# Patient Record
Sex: Female | Born: 2000 | Race: Black or African American | Hispanic: No | Marital: Single | Smoking: Never smoker
Health system: Southern US, Community
[De-identification: ages and names within clinical notes are randomized; demographics above are authoritative.]

## PROBLEM LIST (undated history)

## (undated) DIAGNOSIS — F909 Attention-deficit hyperactivity disorder, unspecified type: Secondary | ICD-10-CM

## (undated) DIAGNOSIS — F329 Major depressive disorder, single episode, unspecified: Secondary | ICD-10-CM

## (undated) DIAGNOSIS — F32A Depression, unspecified: Secondary | ICD-10-CM

## (undated) HISTORY — PX: WISDOM TOOTH EXTRACTION: SHX21

---
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
## 2021-01-06 ENCOUNTER — Other Ambulatory Visit: Payer: Self-pay

## 2021-01-06 MED ORDER — AMOXICILLIN 500 MG PO CAPS
500.0000 mg | ORAL_CAPSULE | Freq: Three times a day (TID) | ORAL | 0 refills | Status: DC
  Filled 2021-01-06: qty 21, 7d supply, fill #0

## 2021-01-06 MED ORDER — CHLORHEXIDINE GLUCONATE 0.12 % MT SOLN
OROMUCOSAL | 0 refills | Status: DC
  Filled 2021-01-06: qty 473, 16d supply, fill #0

## 2021-01-18 ENCOUNTER — Other Ambulatory Visit: Payer: Self-pay

## 2021-01-18 MED ORDER — FLUOXETINE HCL 10 MG PO CAPS
ORAL_CAPSULE | ORAL | 0 refills | Status: DC
  Filled 2021-01-18: qty 90, 30d supply, fill #0

## 2021-01-19 ENCOUNTER — Other Ambulatory Visit: Payer: Self-pay

## 2021-01-19 MED ORDER — MYDAYIS 25 MG PO CP24
ORAL_CAPSULE | ORAL | 0 refills | Status: DC
  Filled 2021-01-19: qty 30, 30d supply, fill #0

## 2021-01-22 ENCOUNTER — Other Ambulatory Visit: Payer: Self-pay

## 2021-01-22 MED ORDER — OXYCODONE-ACETAMINOPHEN 7.5-325 MG PO TABS
ORAL_TABLET | ORAL | 0 refills | Status: DC
  Filled 2021-01-22: qty 8, 3d supply, fill #0

## 2021-01-22 MED ORDER — IBUPROFEN 600 MG PO TABS
ORAL_TABLET | ORAL | 1 refills | Status: DC
  Filled 2021-01-22: qty 20, 5d supply, fill #0

## 2021-02-16 ENCOUNTER — Other Ambulatory Visit: Payer: Self-pay

## 2021-02-17 ENCOUNTER — Other Ambulatory Visit: Payer: Self-pay

## 2021-02-17 MED ORDER — MYDAYIS 25 MG PO CP24
ORAL_CAPSULE | ORAL | 0 refills | Status: DC
  Filled 2021-02-17: qty 30, 30d supply, fill #0

## 2021-02-18 ENCOUNTER — Other Ambulatory Visit: Payer: Self-pay

## 2021-02-22 ENCOUNTER — Other Ambulatory Visit: Payer: Self-pay

## 2021-03-04 ENCOUNTER — Ambulatory Visit (INDEPENDENT_AMBULATORY_CARE_PROVIDER_SITE_OTHER): Payer: No Typology Code available for payment source

## 2021-03-04 ENCOUNTER — Other Ambulatory Visit: Payer: Self-pay

## 2021-03-04 DIAGNOSIS — Z23 Encounter for immunization: Secondary | ICD-10-CM | POA: Diagnosis not present

## 2021-03-05 ENCOUNTER — Other Ambulatory Visit: Payer: Self-pay

## 2021-03-08 ENCOUNTER — Other Ambulatory Visit: Payer: Self-pay

## 2021-03-11 ENCOUNTER — Other Ambulatory Visit: Payer: Self-pay

## 2021-03-11 MED ORDER — FLUOXETINE HCL 10 MG PO CAPS
ORAL_CAPSULE | ORAL | 0 refills | Status: DC
  Filled 2021-03-11: qty 90, 30d supply, fill #0

## 2021-03-24 ENCOUNTER — Other Ambulatory Visit: Payer: Self-pay

## 2021-03-24 MED ORDER — MYDAYIS 25 MG PO CP24
ORAL_CAPSULE | ORAL | 0 refills | Status: DC
  Filled 2021-03-24: qty 30, 30d supply, fill #0

## 2021-04-20 ENCOUNTER — Other Ambulatory Visit: Payer: Self-pay

## 2021-04-21 ENCOUNTER — Other Ambulatory Visit: Payer: Self-pay

## 2021-04-21 MED ORDER — MYDAYIS 25 MG PO CP24
ORAL_CAPSULE | ORAL | 0 refills | Status: DC
  Filled 2021-04-21: qty 30, 30d supply, fill #0

## 2021-04-22 ENCOUNTER — Other Ambulatory Visit: Payer: Self-pay

## 2021-04-23 ENCOUNTER — Other Ambulatory Visit: Payer: Self-pay

## 2021-04-23 MED ORDER — FLUOXETINE HCL 10 MG PO CAPS
ORAL_CAPSULE | ORAL | 0 refills | Status: DC
  Filled 2021-04-23: qty 90, 30d supply, fill #0

## 2021-05-24 ENCOUNTER — Other Ambulatory Visit: Payer: Self-pay

## 2021-05-24 MED ORDER — MYDAYIS 25 MG PO CP24
ORAL_CAPSULE | ORAL | 0 refills | Status: DC
  Filled 2021-05-24: qty 10, 10d supply, fill #0

## 2021-05-25 ENCOUNTER — Other Ambulatory Visit: Payer: Self-pay | Admitting: Primary Care

## 2021-05-25 ENCOUNTER — Other Ambulatory Visit: Payer: Self-pay

## 2021-05-25 DIAGNOSIS — N920 Excessive and frequent menstruation with regular cycle: Secondary | ICD-10-CM

## 2021-05-25 DIAGNOSIS — N946 Dysmenorrhea, unspecified: Secondary | ICD-10-CM

## 2021-05-25 MED ORDER — MEDROXYPROGESTERONE ACETATE 150 MG/ML IM SUSY
150.0000 mg | PREFILLED_SYRINGE | INTRAMUSCULAR | 1 refills | Status: DC
  Filled 2021-05-25: qty 1, 84d supply, fill #0
  Filled 2021-08-25: qty 1, 84d supply, fill #1

## 2021-05-25 MED ORDER — MYDAYIS 25 MG PO CP24: ORAL_CAPSULE | ORAL | 0 refills | Status: DC

## 2021-05-28 ENCOUNTER — Other Ambulatory Visit: Payer: Self-pay

## 2021-05-28 MED ORDER — MYDAYIS 25 MG PO CP24: ORAL_CAPSULE | ORAL | 0 refills | Status: DC

## 2021-05-31 ENCOUNTER — Other Ambulatory Visit (HOSPITAL_COMMUNITY): Payer: Self-pay

## 2021-05-31 ENCOUNTER — Other Ambulatory Visit: Payer: Self-pay

## 2021-05-31 MED ORDER — FLUOXETINE HCL 10 MG PO CAPS
ORAL_CAPSULE | ORAL | 0 refills | Status: DC
  Filled 2021-05-31: qty 90, 30d supply, fill #0

## 2021-06-01 ENCOUNTER — Other Ambulatory Visit: Payer: Self-pay

## 2021-06-02 ENCOUNTER — Other Ambulatory Visit: Payer: Self-pay

## 2021-06-02 MED ORDER — MYDAYIS 25 MG PO CP24
ORAL_CAPSULE | ORAL | 0 refills | Status: DC
  Filled 2021-07-02: qty 30, 30d supply, fill #0

## 2021-06-02 MED ORDER — FLUOXETINE HCL 10 MG PO CAPS
ORAL_CAPSULE | ORAL | 5 refills | Status: DC
  Filled 2021-06-02 – 2021-07-01 (×2): qty 90, 30d supply, fill #0
  Filled 2021-07-31: qty 90, 30d supply, fill #1
  Filled 2021-08-26: qty 90, 30d supply, fill #2
  Filled 2021-10-15: qty 90, 30d supply, fill #3

## 2021-06-02 MED ORDER — MYDAYIS 25 MG PO CP24
ORAL_CAPSULE | ORAL | 0 refills | Status: DC
  Filled 2021-06-02: qty 30, 30d supply, fill #0

## 2021-06-23 ENCOUNTER — Ambulatory Visit (INDEPENDENT_AMBULATORY_CARE_PROVIDER_SITE_OTHER): Payer: No Typology Code available for payment source | Admitting: Primary Care

## 2021-06-23 ENCOUNTER — Encounter: Payer: Self-pay | Admitting: Primary Care

## 2021-06-23 VITALS — BP 108/72 | HR 84 | Ht 68.0 in | Wt 172.3 lb

## 2021-06-23 DIAGNOSIS — R55 Syncope and collapse: Secondary | ICD-10-CM | POA: Diagnosis not present

## 2021-06-23 DIAGNOSIS — F909 Attention-deficit hyperactivity disorder, unspecified type: Secondary | ICD-10-CM | POA: Diagnosis not present

## 2021-06-23 DIAGNOSIS — Z0001 Encounter for general adult medical examination with abnormal findings: Secondary | ICD-10-CM | POA: Diagnosis not present

## 2021-06-23 DIAGNOSIS — F4323 Adjustment disorder with mixed anxiety and depressed mood: Secondary | ICD-10-CM | POA: Diagnosis not present

## 2021-06-23 DIAGNOSIS — L309 Dermatitis, unspecified: Secondary | ICD-10-CM | POA: Diagnosis not present

## 2021-06-23 DIAGNOSIS — N946 Dysmenorrhea, unspecified: Secondary | ICD-10-CM

## 2021-06-23 DIAGNOSIS — N920 Excessive and frequent menstruation with regular cycle: Secondary | ICD-10-CM

## 2021-06-23 NOTE — Patient Instructions (Addendum)
Stop by the lab prior to leaving today. I will notify you of your results once received.  ? ?Please notify your psychiatrist of these episodes. ? ?It was a pleasure to see you today! ? ?

## 2021-06-23 NOTE — Assessment & Plan Note (Signed)
Controlled. ?Following with psychiatry. ? ?Continue fluoxetine 10 mg daily. ?

## 2021-06-23 NOTE — Assessment & Plan Note (Signed)
Controlled. ? ?Continue triamcinolone 0.5% ointment as needed. ?

## 2021-06-23 NOTE — Assessment & Plan Note (Signed)
Controlled. ? ?Continue Depo-Provera every 3 months. ?

## 2021-06-23 NOTE — Assessment & Plan Note (Signed)
Controlled. ? ?Continue Depo-Provera injections every 3 months. ? ?

## 2021-06-23 NOTE — Assessment & Plan Note (Signed)
Controlled. ? ?Following with psychiatry. ? ?Advise she notify her psychiatrist regarding her syncopal episodes. ? ?Continue Mydayis 25 mg daily. ?

## 2021-06-23 NOTE — Assessment & Plan Note (Signed)
Immunizations up-to-date. ? ?Pap smear due at age 21. ? ?Encouraged healthy diet regular exercise. ? ?Exam today as noted. ?Labs pending. ?

## 2021-06-23 NOTE — Assessment & Plan Note (Signed)
Appears to date back years ago, but overall infrequent.  Now frequent since November 2022. ? ?Unclear etiology.  Could be vasovagal versus dehydration. ? ?ECG today with normal sinus rhythm with rate of 99, no PAC/PVCs, no ST changes.  No old EKG to compare. ? ?Negative orthostatic hypotension in the office today. ?Labs pending. ? ?Consider echocardiogram versus cardiology referral.  Will consult with patient's mother. ?

## 2021-06-23 NOTE — Progress Notes (Signed)
? ?Subjective:  ? ? Patient ID: Nicole Bonilla, female    DOB: [birth year], 21 y.o.   MRN: [id number] ? ?HPI ? ?Nicole Bonilla is a very pleasant 21 y.o. female with a history of dysmenorrhea, ADHD on high risk medication, anxiety depression, menorrhagia with regular cycle, goiter who presents today for complete physical, follow up of chronic conditions, and to discuss syncope. ? ?Several episodes of syncopal episodes that originally began years ago. Her initial episode occurred while playing basketball in the heat. She was not evaluated for this.  ? ?Recent symptoms include feeling dizzy, full body sweats, and will pass out. She is aware of her surroundings during each episode, but it's hard to keep her eyes open and she has trouble responding. Her episodes have happened over the last 6-9 months.  ? ?Two episodes have occurred at home. Her first episode occurred after her mother gave her a Depo Provera injection. The second episode occurred just after her tooth extraction. She was standing, holding her head back, felt dizzy, sat down on the toilet, leaned forward and her mother had to catch her.  ? ?Two episodes have occurred at work. She works in Copy at [employer]. Both episodes occurred in the bathroom at work, sitting on the toilet, started feeling dizzy and sweaty. She did not pass out completely for either of these episodes.  ? ?She will eat breakfast most days of the week, sometimes a snack, but does not eat lunch or dinner. She drinks at least three bottles of water daily. . She feels well managed on Mydayis for ADHD and fluoxetine for anxiety and depression. She has not told her psychiatrist about these episodes.  ? ?She does notice intermittent dizziness when rising from sitting for prolonged periods of time, or when first getting out of bed.  ? ?She is managed on Depo Provera for menorrhagia, menses is much lighter and much shorter since being on this regimen.  ? ?She denies palpitations, illegal  drug use, new supplements over-the-counter, convulsions. ? ?Immunizations: ?-Tetanus: 2014 ?-Influenza: Completed this season  ?-Covid-19: 2 vaccines  ? ?-HPV: Completed course ? ?Diet: Fair diet.  ?Exercise: No regular exercise. ? ?Eye exam: No recent visit.  ?Dental exam: Completes semi-annually  ? ?BP Readings from Last 3 Encounters:  ?06/23/21 108/72  ?06/30/20 118/62  ?05/05/20 118/62  ? ? ? ?Review of Systems  ?Constitutional:  Negative for unexpected weight change.  ?HENT:  Negative for rhinorrhea.   ?Eyes:  Negative for visual disturbance.  ?Respiratory:  Negative for cough and shortness of breath.   ?Cardiovascular:  Negative for chest pain.  ?Gastrointestinal:  Negative for constipation and diarrhea.  ?Genitourinary:  Negative for difficulty urinating and menstrual problem.  ?Musculoskeletal:  Negative for arthralgias and myalgias.  ?Skin:  Negative for rash.  ?Allergic/Immunologic: Negative for environmental allergies.  ?Neurological:  Positive for dizziness, syncope and headaches.  ?Psychiatric/Behavioral:  The patient is not nervous/anxious.   ? ?   ? ? ?Past Medical History:  ?Diagnosis Date  ? Abnormal facial hair 12/13/2012  ? ADHD (attention deficit hyperactivity disorder)   ? Depression   ? Dyspepsia 12/13/2012  ? Overweight, pediatric, BMI 85.0-94.9 percentile for age 55/04/2012  ? ? ?Social History  ? ?Socioeconomic History  ? Marital status: Single  ?  Spouse name: Not on file  ? Number of children: Not on file  ? Years of education: Not on file  ? Highest education level: Not on file  ?Occupational History  ? Not on  file  ?Tobacco Use  ? Smoking status: Never  ? Smokeless tobacco: Not on file  ?Vaping Use  ? Vaping Use: Never used  ?Substance and Sexual Activity  ? Alcohol use: No  ? Drug use: No  ? Sexual activity: Never  ?Other Topics Concern  ? Not on file  ?Social History Narrative  ? Is in 6th grade at [employer]  ? ?Social Determinants of Health  ? ?Financial Resource Strain: Not on  file  ?Food Insecurity: Not on file  ?Transportation Needs: Not on file  ?Physical Activity: Not on file  ?Stress: Not on file  ?Social Connections: Not on file  ?Intimate Partner Violence: Not on file  ? ? ?Past Surgical History:  ?Procedure Laterality Date  ? WISDOM TOOTH EXTRACTION    ? ? ?Family History  ?Problem Relation Age of Onset  ? Colon cancer Maternal Grandmother   ? ? ?No Known Allergies ? ?Current Outpatient Medications on File Prior to Visit  ?Medication Sig Dispense Refill  ? amoxicillin (AMOXIL) 500 MG capsule Take 1 capsule (500 mg total) by mouth 3 (three) times daily. Start the day before surgery. Do not take the morning of the surgery. 21 capsule 0  ? Amphet-Dextroamphet 3-Bead ER (MYDAYIS) 25 MG CP24 Take 1 capsule by mouth once daily. 30 capsule 0  ? Amphet-Dextroamphet 3-Bead ER (MYDAYIS) 25 MG CP24 Take 1 cap po QAM 20 capsule 0  ? Amphet-Dextroamphet 3-Bead ER (MYDAYIS) 25 MG CP24 Take 1 cap po QAM 7 capsule 0  ? Amphet-Dextroamphet 3-Bead ER (MYDAYIS) 25 MG CP24 Take 1 capsule(s) by mouth every morning 30 capsule 0  ? [START ON 07/02/2021] Amphet-Dextroamphet 3-Bead ER (MYDAYIS) 25 MG CP24 Take 1 capsule(s) by mouth every morning 30 capsule 0  ? chlorhexidine (PERIDEX) 0.12 % solution swish 15 mL (1 capful) in mouth for 30 seconds, then expectorate twice daily. Start the day before surgery and continue the day after 473 mL 0  ? FLUoxetine (PROZAC) 10 MG capsule Take 3 caps QAM w/ food 90 capsule 5  ? FLUoxetine (PROZAC) 10 MG capsule Take 3 capsules by mouth every morning with food 90 capsule 5  ? FLUoxetine (PROZAC) 10 MG capsule Take 3 caps QAM w/ food 90 capsule 0  ? FLUoxetine (PROZAC) 10 MG capsule Take 3 caps QAM w/ food 90 capsule 0  ? FLUoxetine (PROZAC) 10 MG capsule Take 3 caps QAM w/ food 90 capsule 0  ? FLUoxetine (PROZAC) 10 MG capsule Take 3 caps QAM w/ food 90 capsule 5  ? FLUoxetine (PROZAC) 20 MG capsule Take 20 mg by mouth daily.    ? ibuprofen (ADVIL) 600 MG tablet Take 1  tablet by mouth every 6 hours for 5 days after surgery 20 tablet 1  ? medroxyPROGESTERone Acetate 150 MG/ML SUSY Inject 1 mL (150 mg total) into the muscle every 3 (three) months. 1 mL 1  ? oxyCODONE-acetaminophen (PERCOCET) 7.5-325 MG tablet take 1 tab Q6H as needed for pain 8 tablet 0  ? triamcinolone ointment (KENALOG) 0.5 % Apply 1 application topically 2 (two) times daily. 30 g 0  ? ?No current facility-administered medications on file prior to visit.  ? ? ?BP 108/72   Pulse 84   Ht 5\' 8"  (1.727 m)   Wt 172 lb 4.8 oz (78.2 kg)   SpO2 98%   BMI 26.20 kg/m?  ?Objective:  ? Physical Exam ?HENT:  ?   Right Ear: Tympanic membrane and ear canal normal.  ?  Left Ear: Tympanic membrane and ear canal normal.  ?   Nose: Nose normal.  ?Eyes:  ?   Conjunctiva/sclera: Conjunctivae normal.  ?   Pupils: Pupils are equal, round, and reactive to light.  ?Neck:  ?   Thyroid: No thyromegaly.  ?Cardiovascular:  ?   Rate and Rhythm: Normal rate and regular rhythm.  ?   Heart sounds: No murmur heard. ?Pulmonary:  ?   Effort: Pulmonary effort is normal.  ?   Breath sounds: Normal breath sounds. No rales.  ?Abdominal:  ?   General: Bowel sounds are normal.  ?   Palpations: Abdomen is soft.  ?   Tenderness: There is no abdominal tenderness.  ?Musculoskeletal:     ?   General: Normal range of motion.  ?   Cervical back: Neck supple.  ?Lymphadenopathy:  ?   Cervical: No cervical adenopathy.  ?Skin: ?   General: Skin is warm and dry.  ?   Findings: No rash.  ?Neurological:  ?   Mental Status: She is alert and oriented to person, place, and time.  ?   Cranial Nerves: No cranial nerve deficit.  ?   Deep Tendon Reflexes: Reflexes are normal and symmetric.  ?Psychiatric:     ?   Mood and Affect: Mood normal.  ? ? ? ? ? ?   ?Assessment & Plan:  ? ? ? ? ?This visit occurred during the SARS-CoV-2 public health emergency.  Safety protocols were in place, including screening questions prior to the visit, additional usage of staff PPE, and  extensive cleaning of exam room while observing appropriate contact time as indicated for disinfecting solutions.  ?

## 2021-06-24 ENCOUNTER — Other Ambulatory Visit: Payer: Self-pay | Admitting: Primary Care

## 2021-06-24 DIAGNOSIS — R55 Syncope and collapse: Secondary | ICD-10-CM

## 2021-06-24 LAB — LIPID PANEL
Cholesterol: 147 mg/dL (ref 0–200)
HDL: 52.5 mg/dL (ref >39.00)
LDL Cholesterol: 85 mg/dL (ref 0–99)
NonHDL: 94.78
Total CHOL/HDL Ratio: 3
Triglycerides: 50 mg/dL (ref 0.0–149.0)
VLDL: 10 mg/dL (ref 0.0–40.0)

## 2021-06-24 LAB — CBC
HCT: 40.1 % (ref 36.0–46.0)
Hemoglobin: 13.5 g/dL (ref 12.0–15.0)
MCHC: 33.7 g/dL (ref 30.0–36.0)
MCV: 81.1 fl (ref 78.0–100.0)
Platelets: 379 10*3/uL (ref 150.0–400.0)
RBC: 4.95 Mil/uL (ref 3.87–5.11)
RDW: 12.8 % (ref 11.5–14.6)
WBC: 7 10*3/uL (ref 4.5–10.5)

## 2021-06-24 LAB — COMPREHENSIVE METABOLIC PANEL
ALT: 11 U/L (ref 0–35)
AST: 12 U/L (ref 0–37)
Albumin: 4.4 g/dL (ref 3.5–5.2)
Alkaline Phosphatase: 49 U/L (ref 39–117)
BUN: 12 mg/dL (ref 6–23)
CO2: 26 mEq/L (ref 19–32)
Calcium: 9.6 mg/dL (ref 8.4–10.5)
Chloride: 104 mEq/L (ref 96–112)
Creatinine, Ser: 0.78 mg/dL (ref 0.40–1.20)
GFR: 109.53 mL/min (ref >60.00)
Glucose, Bld: 82 mg/dL (ref 70–99)
Potassium: 4.3 mEq/L (ref 3.5–5.1)
Sodium: 136 mEq/L (ref 135–145)
Total Bilirubin: 0.7 mg/dL (ref 0.2–1.2)
Total Protein: 7.1 g/dL (ref 6.0–8.3)

## 2021-06-24 LAB — IBC + FERRITIN
Ferritin: 20.7 ng/mL (ref 10.0–291.0)
Iron: 151 ug/dL — ABNORMAL HIGH (ref 42–145)
Saturation Ratios: 45.9 % (ref 20.0–50.0)
TIBC: 329 ug/dL (ref 250.0–450.0)
Transferrin: 235 mg/dL (ref 212.0–360.0)

## 2021-06-24 LAB — HEMOGLOBIN A1C: Hgb A1c MFr Bld: 4.6 % (ref 4.6–6.5)

## 2021-06-24 LAB — TSH: TSH: 1.37 u[IU]/mL (ref 0.35–5.50)

## 2021-07-01 ENCOUNTER — Other Ambulatory Visit: Payer: Self-pay

## 2021-07-02 ENCOUNTER — Other Ambulatory Visit: Payer: Self-pay

## 2021-07-30 NOTE — Progress Notes (Signed)
Cardiology Office Note  Date:  08/02/2021   ID:  Nicole Bonilla 07-06-2000, MRN [id number]  PCP:  Nicole Bonilla   Chief Complaint  Patient presents with   New Patient (Initial Visit)    Ref by Nicole Rieger, Bonilla for syncope. Patient c/o having more frequent spells of syncope, has noticed syncope for about 8 months. Medications reviewed by the patient verbally.     HPI:  Nicole Bonilla is a 21 year old woman with past medical history of dysmenorrhea,  ADHD on high risk medication,  anxiety depression,  goiter Referred by Nicole Bonilla for consultation of her syncope, near syncope  Nicole Bonilla presents today with her mother Currently working at [employer] Used to play baseball, reported having symptoms of dizziness at times with practice Near syncope symptoms dating back 7 years   Mother reports several episodes of what appeared to be loss of consciousness One episode was in bed, got up, showed mother something in mouth, head back Acutely developed near syncope then LOC, mother caught her, after 10 sec, waking, mother able to lay around on the bed  One episode during a meal, looked pale, layed down , slow recovery  Per primary care notes, One episode occurred after her mother gave her a Depo Provera injection. The second episode occurred just after her tooth extraction. She was standing, holding her head back, felt dizzy, sat down on the toilet, leaned forward and her mother had to catch her.   Two episodes have occurred at work. She works in Recruitment consultant at [employer]. Both episodes occurred in the bathroom at work, sitting on the toilet, started feeling dizzy and sweaty.  Recent weight loss over the past year, 10 pounds in the past year, unintentional  Orthostatics done in the office today, no dramatic drop in blood pressure with standing, slight climbing heart rate from 99 up to 108  Difficulty staying hydrated This morning has not had much to eat or drink  yet  EKG personally reviewed by myself on todays visit Sinus tachycardia rate 104 bpm no significant ST or T wave changes   PMH:   has a past medical history of Abnormal facial hair (12/13/2012), ADHD (attention deficit hyperactivity disorder), Depression, Dyspepsia (12/13/2012), and Overweight, pediatric, BMI 85.0-94.9 percentile for age (12/13/2012).  PSH:    Past Surgical History:  Procedure Laterality Date   WISDOM TOOTH EXTRACTION      Current Outpatient Medications  Medication Sig Dispense Refill   Amphet-Dextroamphet 3-Bead ER (MYDAYIS) 25 MG CP24 Take 1 capsule by mouth once daily. 30 capsule 0   FLUoxetine (PROZAC) 10 MG capsule Take 3 capsules by mouth every morning with food 90 capsule 5   medroxyPROGESTERone Acetate 150 MG/ML SUSY Inject 1 mL (150 mg total) into the muscle every 3 (three) months. 1 mL 1   triamcinolone ointment (KENALOG) 0.5 % Apply 1 application topically 2 (two) times daily. 30 g 0   amoxicillin (AMOXIL) 500 MG capsule Take 1 capsule (500 mg total) by mouth 3 (three) times daily. Start the day before surgery. Do not take the morning of the surgery. (Patient not taking: Reported on 08/02/2021) 21 capsule 0   Amphet-Dextroamphet 3-Bead ER (MYDAYIS) 25 MG CP24 Take 1 cap po QAM (Patient not taking: Reported on 08/02/2021) 20 capsule 0   Amphet-Dextroamphet 3-Bead ER (MYDAYIS) 25 MG CP24 Take 1 cap po QAM (Patient not taking: Reported on 08/02/2021) 7 capsule 0   Amphet-Dextroamphet 3-Bead ER (MYDAYIS) 25 MG CP24 Take 1 capsule(s)  by mouth every morning (Patient not taking: Reported on 08/02/2021) 30 capsule 0   Amphet-Dextroamphet 3-Bead ER (MYDAYIS) 25 MG CP24 Take 1 capsule(s) by mouth every morning (Patient not taking: Reported on 08/02/2021) 30 capsule 0   Amphet-Dextroamphet 3-Bead ER (MYDAYIS) 25 MG CP24 Take 1 capsule(s) by mouth every morning (Patient not taking: Reported on 08/02/2021) 30 capsule 0   chlorhexidine (PERIDEX) 0.12 % solution swish 15 mL (1 capful)  in mouth for 30 seconds, then expectorate twice daily. Start the day before surgery and continue the day after (Patient not taking: Reported on 08/02/2021) 473 mL 0   FLUoxetine (PROZAC) 10 MG capsule Take 3 caps QAM w/ food (Patient not taking: Reported on 08/02/2021) 90 capsule 5   FLUoxetine (PROZAC) 10 MG capsule Take 3 caps QAM w/ food (Patient not taking: Reported on 08/02/2021) 90 capsule 0   FLUoxetine (PROZAC) 10 MG capsule Take 3 caps QAM w/ food (Patient not taking: Reported on 08/02/2021) 90 capsule 0   FLUoxetine (PROZAC) 10 MG capsule Take 3 caps QAM w/ food (Patient not taking: Reported on 08/02/2021) 90 capsule 0   FLUoxetine (PROZAC) 10 MG capsule Take 3 caps QAM w/ food (Patient not taking: Reported on 08/02/2021) 90 capsule 5   FLUoxetine (PROZAC) 20 MG capsule Take 20 mg by mouth daily. (Patient not taking: Reported on 08/02/2021)     ibuprofen (ADVIL) 600 MG tablet Take 1 tablet by mouth every 6 hours for 5 days after surgery (Patient not taking: Reported on 08/02/2021) 20 tablet 1   oxyCODONE-acetaminophen (PERCOCET) 7.5-325 MG tablet take 1 tab Q6H as needed for pain (Patient not taking: Reported on 08/02/2021) 8 tablet 0   No current facility-administered medications for this visit.     Allergies:   Patient has no known allergies.   Social History:  The patient  reports that she has never smoked. She does not have any smokeless tobacco history on file. She reports that she does not drink alcohol and does not use drugs.   Family History:   family history includes Colon cancer in her maternal grandmother.    Review of Systems: Review of Systems  Constitutional: Negative.   Respiratory: Negative.    Cardiovascular: Negative.   Gastrointestinal: Negative.   Musculoskeletal: Negative.   Neurological:  Positive for loss of consciousness.  Psychiatric/Behavioral: Negative.    All other systems reviewed and are negative.   PHYSICAL EXAM: VS:  BP 118/74 (BP Location: Right  Arm, Patient Position: Sitting, Cuff Size: Normal)   Pulse (!) 104   Ht 5\' 8"  (1.727 m)   Wt 170 lb (77.1 kg)   SpO2 99%   BMI 25.85 kg/m  , BMI Body mass index is 25.85 kg/m. GEN: Well nourished, well developed, in no acute distress HEENT: normal Neck: no JVD, carotid bruits, or masses Cardiac: RRR; no murmurs, rubs, or gallops,no edema  Respiratory:  clear to auscultation bilaterally, normal work of breathing GI: soft, nontender, nondistended, + BS MS: no deformity or atrophy Skin: warm and dry, no rash Neuro:  Strength and sensation are intact Psych: euthymic mood, full affect  Recent Labs: 06/23/2021: ALT 11; BUN 12; Creatinine, Ser 0.78; Hemoglobin 13.5; Platelets 379.0; Potassium 4.3; Sodium 136; TSH 1.37    Lipid Panel Lab Results  Component Value Date   CHOL 147 06/23/2021   HDL 52.50 06/23/2021   LDLCALC 85 06/23/2021   TRIG 50.0 06/23/2021      Wt Readings from Last 3 Encounters:  08/02/21 170  lb (77.1 kg)  06/23/21 172 lb 4.8 oz (78.2 kg)  06/30/20 183 lb (83 kg) (95 %, Z= 1.68)*   * Growth percentiles are based on CDC (Girls, 2-20 Years) data.      ASSESSMENT AND PLAN:  Problem List Items Addressed This Visit     ADHD   Syncope - Primary   Relevant Orders   EKG 12-Lead   ECHOCARDIOGRAM COMPLETE   Other Visit Diagnoses     Weight loss          Syncope Concerning for orthostasis, unable to exclude vasovagal etiology Echocardiogram has been ordered to rule out structural heart disease We will hold off on a Zio monitor though this was discussed Recommend fluid hydration, try not to miss meals Would try to limit further weight loss Discussed precautions such as sitting or laying down when she has diaphoresis symptoms and hydrating -For additional episodes recommend they call our office for further discussion  Weight loss Weight appears to be down around 10 pounds over the past year Discussed relationship between weight and blood  pressure Again recommended she try to avoid missing meals, stay hydrated  ADHD No indication to change medication at this time Management primary care   Total encounter time more than 60 minutes  Greater than 50% was spent in counseling and coordination of care with the patient  Patient seen in consultation for Nicole RiegerKatherine Clark will be referred back to her office for ongoing care of the issues detailed above  Signed, Dossie Arbourim Crescent Gotham, M.D., Ph.D. [facility], [place] [phone]

## 2021-07-31 ENCOUNTER — Other Ambulatory Visit: Payer: Self-pay

## 2021-07-31 MED ORDER — MYDAYIS 25 MG PO CP24
ORAL_CAPSULE | ORAL | 0 refills | Status: DC
  Filled 2021-07-31: qty 30, 30d supply, fill #0

## 2021-08-02 ENCOUNTER — Other Ambulatory Visit: Payer: Self-pay

## 2021-08-02 ENCOUNTER — Encounter: Payer: Self-pay | Admitting: Cardiovascular Disease

## 2021-08-02 ENCOUNTER — Ambulatory Visit: Payer: No Typology Code available for payment source | Admitting: Cardiovascular Disease

## 2021-08-02 VITALS — BP 118/74 | HR 104 | Ht 68.0 in | Wt 170.0 lb

## 2021-08-02 DIAGNOSIS — R634 Abnormal weight loss: Secondary | ICD-10-CM | POA: Diagnosis not present

## 2021-08-02 DIAGNOSIS — R55 Syncope and collapse: Secondary | ICD-10-CM | POA: Diagnosis not present

## 2021-08-02 DIAGNOSIS — F909 Attention-deficit hyperactivity disorder, unspecified type: Secondary | ICD-10-CM | POA: Diagnosis not present

## 2021-08-02 NOTE — Patient Instructions (Addendum)
Medication Instructions:  No changes  If you need a refill on your cardiac medications before your next appointment, please call your pharmacy.   Lab work: No new labs needed  Testing/Procedures:  Your physician has requested that you have an echocardiogram. Echocardiography is a painless test that uses sound waves to create images of your heart. It provides your doctor with information about the size and shape of your heart and how well your heart's chambers and valves are working. This procedure takes approximately one hour. There are no restrictions for this procedure.   Follow-Up: At [facility], you and your health needs are our priority.  As part of our continuing mission to provide you with exceptional heart care, we have created designated Provider Care Teams.  These Care Teams include your primary Cardiologist (physician) and Advanced Practice Providers (APPs -  Physician Assistants and Nurse Practitioners) who all work together to provide you with the care you need, when you need it.  You will need a follow up appointment as needed  Providers on your designated Care Team:   Christopher Berge, NP Ryan Dunn, PA-C Cadence Furth, PA-C  COVID-19 Vaccine Information can be found at: [email or web address] For questions related to vaccine distribution or appointments, please email [email or web address] or call [phone].   

## 2021-08-25 ENCOUNTER — Other Ambulatory Visit: Payer: Self-pay

## 2021-08-26 ENCOUNTER — Other Ambulatory Visit: Payer: Self-pay

## 2021-08-30 ENCOUNTER — Other Ambulatory Visit: Payer: Self-pay

## 2021-08-30 MED ORDER — MYDAYIS 25 MG PO CP24
ORAL_CAPSULE | ORAL | 0 refills | Status: DC
  Filled 2021-08-30 (×2): qty 30, 30d supply, fill #0

## 2021-09-07 ENCOUNTER — Ambulatory Visit (INDEPENDENT_AMBULATORY_CARE_PROVIDER_SITE_OTHER): Payer: No Typology Code available for payment source

## 2021-09-07 DIAGNOSIS — R55 Syncope and collapse: Secondary | ICD-10-CM | POA: Diagnosis not present

## 2021-09-08 LAB — ECHOCARDIOGRAM COMPLETE
AR max vel: 2.31 cm2
AV Area VTI: 2.56 cm2
AV Area mean vel: 2.26 cm2
AV Mean grad: 5 mmHg
AV Peak grad: 10.1 mmHg
Ao pk vel: 1.59 m/s
Area-P 1/2: 4.08 cm2
Calc EF: 68.6 %
S' Lateral: 2.6 cm
Single Plane A2C EF: 75.4 %
Single Plane A4C EF: 64.6 %

## 2021-09-29 ENCOUNTER — Other Ambulatory Visit: Payer: Self-pay

## 2021-09-29 MED ORDER — MYDAYIS 25 MG PO CP24
ORAL_CAPSULE | ORAL | 0 refills | Status: DC
  Filled 2021-09-29: qty 30, 30d supply, fill #0

## 2021-10-15 ENCOUNTER — Other Ambulatory Visit: Payer: Self-pay

## 2021-10-29 ENCOUNTER — Other Ambulatory Visit: Payer: Self-pay

## 2021-10-29 MED ORDER — MYDAYIS 25 MG PO CP24
ORAL_CAPSULE | ORAL | 0 refills | Status: DC
Start: 2021-10-29 — End: 2021-11-25
  Filled 2021-10-29: qty 30, 30d supply, fill #0

## 2021-10-31 ENCOUNTER — Other Ambulatory Visit: Payer: Self-pay

## 2021-11-01 ENCOUNTER — Other Ambulatory Visit: Payer: Self-pay

## 2021-11-05 ENCOUNTER — Encounter: Payer: Self-pay | Admitting: Primary Care

## 2021-11-05 ENCOUNTER — Other Ambulatory Visit: Payer: Self-pay

## 2021-11-05 ENCOUNTER — Ambulatory Visit (INDEPENDENT_AMBULATORY_CARE_PROVIDER_SITE_OTHER): Payer: No Typology Code available for payment source | Admitting: Primary Care

## 2021-11-05 VITALS — BP 110/80 | HR 88 | Temp 98.3°F | Resp 16 | Ht 68.0 in | Wt 163.0 lb

## 2021-11-05 DIAGNOSIS — N939 Abnormal uterine and vaginal bleeding, unspecified: Secondary | ICD-10-CM | POA: Diagnosis not present

## 2021-11-05 LAB — CBC
HCT: 39.4 % (ref 36.0–46.0)
Hemoglobin: 13.2 g/dL (ref 12.0–15.0)
MCHC: 33.5 g/dL (ref 30.0–36.0)
MCV: 81.2 fl (ref 78.0–100.0)
Platelets: 353 10*3/uL (ref 150.0–400.0)
RBC: 4.86 Mil/uL (ref 3.87–5.11)
RDW: 12.5 % (ref 11.5–14.6)
WBC: 7.6 10*3/uL (ref 4.5–10.5)

## 2021-11-05 LAB — IBC + FERRITIN
Ferritin: 26.2 ng/mL (ref 10.0–291.0)
Iron: 105 ug/dL (ref 42–145)
Saturation Ratios: 31.1 % (ref 20.0–50.0)
TIBC: 337.4 ug/dL (ref 250.0–450.0)
Transferrin: 241 mg/dL (ref 212.0–360.0)

## 2021-11-05 LAB — TSH: TSH: 1.54 u[IU]/mL (ref 0.35–5.50)

## 2021-11-05 MED ORDER — NORETHINDRONE ACET-ETHINYL EST 1-20 MG-MCG PO TABS
1.0000 | ORAL_TABLET | Freq: Every day | ORAL | 1 refills | Status: DC
  Filled 2021-11-05: qty 84, 84d supply, fill #0
  Filled 2022-02-24: qty 84, 84d supply, fill #1

## 2021-11-05 NOTE — Patient Instructions (Addendum)
Stop by the lab prior to leaving today. I will notify you of your results once received.   Stop Depo Provera  Start birth control pills:  Take your first birth control pill this Sunday.  You must take your pill around the same time each day.  If you miss a pill then take the pill as soon as you remember, and also take your pill at the regularly scheduled time, even if this means taking two pills in one day. If you miss more than two pills consecutively, then please call me.  It may take three to six months for birth control pills to regulate your cycle and improve symptoms.  We will consider an ultrasound if no improvement.   It was a pleasure to see you today!

## 2021-11-05 NOTE — Progress Notes (Signed)
Subjective:    Patient ID: Nicole Bonilla, female    DOB: 2000-09-05, 21 y.o.   MRN: [id number]  HPI  Nicole Bonilla is a very pleasant 21 y.o. female with a history of menorrhagia with regular cycle, goiter, dysmenorrhea, syncope who presents today to discuss abnormal uterine bleeding.  Currently managed on Depo-Provera injections every 3 months for which she has been taking for years. Today she states that she's experienced daily, vaginal bleeding for the last 4-5 months. Her menses alternates between dark brown and bright red, heavier and lighter at times, sometimes passes dime sized clots.   She's also experienced mostly daily upper abdominal bloating for the last one week and bilateral upper pelvic cramping intermittently for the last month. She denies extreme fatigue, dizziness, and palpitations. She is hydrating well with water.   Her last Depo Provera injection was 08/30/21, her mother administers her injections at home. She denies changes in foods, medications, supplements.    Review of Systems  Constitutional:  Negative for fatigue.  Respiratory:  Negative for shortness of breath.   Cardiovascular:  Negative for palpitations.  Genitourinary:  Positive for menstrual problem and vaginal bleeding.  Neurological:  Negative for dizziness.         Past Medical History:  Diagnosis Date   Abnormal facial hair 12/13/2012   ADHD (attention deficit hyperactivity disorder)    Depression    Dyspepsia 12/13/2012   Overweight, pediatric, BMI 85.0-94.9 percentile for age 24/04/2012    Social History   Socioeconomic History   Marital status: Single    Spouse name: Not on file   Number of children: Not on file   Years of education: Not on file   Highest education level: Not on file  Occupational History   Not on file  Tobacco Use   Smoking status: Never   Smokeless tobacco: Not on file  Vaping Use   Vaping Use: Never used  Substance and Sexual Activity   Alcohol use: No    Drug use: No   Sexual activity: Never  Other Topics Concern   Not on file  Social History Narrative   Is in 6th grade at [employer]   Social Determinants of Health   Financial Resource Strain: Not on file  Food Insecurity: Not on file  Transportation Needs: Not on file  Physical Activity: Not on file  Stress: Not on file  Social Connections: Not on file  Intimate Partner Violence: Not on file    Past Surgical History:  Procedure Laterality Date   WISDOM TOOTH EXTRACTION      Family History  Problem Relation Age of Onset   Colon cancer Maternal Grandmother     No Known Allergies  Current Outpatient Medications on File Prior to Visit  Medication Sig Dispense Refill   Amphet-Dextroamphet 3-Bead ER (MYDAYIS) 25 MG CP24 Take 1 capsule(s) by mouth every morning 30 capsule 0   FLUoxetine (PROZAC) 10 MG capsule Take 3 capsules by mouth every morning with food 90 capsule 5   triamcinolone ointment (KENALOG) 0.5 % Apply 1 application topically 2 (two) times daily. 30 g 0   amoxicillin (AMOXIL) 500 MG capsule Take 1 capsule (500 mg total) by mouth 3 (three) times daily. Start the day before surgery. Do not take the morning of the surgery. (Patient not taking: Reported on 08/02/2021) 21 capsule 0   Amphet-Dextroamphet 3-Bead ER (MYDAYIS) 25 MG CP24 Take 1 capsule by mouth once daily. (Patient not taking: Reported on 11/05/2021) 30 capsule 0  Amphet-Dextroamphet 3-Bead ER (MYDAYIS) 25 MG CP24 Take 1 cap po QAM (Patient not taking: Reported on 08/02/2021) 20 capsule 0   Amphet-Dextroamphet 3-Bead ER (MYDAYIS) 25 MG CP24 Take 1 cap po QAM (Patient not taking: Reported on 08/02/2021) 7 capsule 0   Amphet-Dextroamphet 3-Bead ER (MYDAYIS) 25 MG CP24 Take 1 capsule(s) by mouth every morning (Patient not taking: Reported on 08/02/2021) 30 capsule 0   Amphet-Dextroamphet 3-Bead ER (MYDAYIS) 25 MG CP24 Take 1 capsule(s) by mouth every morning (Patient not taking: Reported on 08/02/2021) 30 capsule  0   Amphet-Dextroamphet 3-Bead ER (MYDAYIS) 25 MG CP24 Take 1 capsule(s) by mouth every morning (Patient not taking: Reported on 08/02/2021) 30 capsule 0   Amphet-Dextroamphet 3-Bead ER (MYDAYIS) 25 MG CP24 Take 1 capsule(s) by mouth every morning (Patient not taking: Reported on 11/05/2021) 30 capsule 0   Amphet-Dextroamphet 3-Bead ER (MYDAYIS) 25 MG CP24 Take 1 capsule(s) by mouth every morning (Patient not taking: Reported on 11/05/2021) 30 capsule 0   chlorhexidine (PERIDEX) 0.12 % solution swish 15 mL (1 capful) in mouth for 30 seconds, then expectorate twice daily. Start the day before surgery and continue the day after 473 mL 0   FLUoxetine (PROZAC) 10 MG capsule Take 3 caps QAM w/ food (Patient not taking: Reported on 08/02/2021) 90 capsule 5   FLUoxetine (PROZAC) 10 MG capsule Take 3 caps QAM w/ food (Patient not taking: Reported on 08/02/2021) 90 capsule 0   FLUoxetine (PROZAC) 10 MG capsule Take 3 caps QAM w/ food (Patient not taking: Reported on 08/02/2021) 90 capsule 0   FLUoxetine (PROZAC) 10 MG capsule Take 3 caps QAM w/ food (Patient not taking: Reported on 08/02/2021) 90 capsule 0   FLUoxetine (PROZAC) 10 MG capsule Take 3 caps QAM w/ food (Patient not taking: Reported on 08/02/2021) 90 capsule 5   FLUoxetine (PROZAC) 20 MG capsule Take 20 mg by mouth daily. (Patient not taking: Reported on 08/02/2021)     ibuprofen (ADVIL) 600 MG tablet Take 1 tablet by mouth every 6 hours for 5 days after surgery 20 tablet 1   oxyCODONE-acetaminophen (PERCOCET) 7.5-325 MG tablet take 1 tab Q6H as needed for pain (Patient not taking: Reported on 08/02/2021) 8 tablet 0   No current facility-administered medications on file prior to visit.    BP 110/80   Pulse 88   Temp 98.3 F (36.8 C)   Resp 16   Ht 5\' 8"  (1.727 m)   Wt 163 lb (73.9 kg)   LMP 08/30/2021 Comment: Been bleeding since June 19  SpO2 98%   BMI 24.78 kg/m  Objective:   Physical Exam Constitutional:      General: She is not in acute  distress. Pulmonary:     Effort: Pulmonary effort is normal.  Abdominal:     General: Bowel sounds are normal.     Palpations: Abdomen is soft.     Tenderness: There is no abdominal tenderness.  Skin:    General: Skin is warm and dry.  Neurological:     Mental Status: She is alert.           Assessment & Plan:   Problem List Items Addressed This Visit       Genitourinary   Abnormal uterine bleeding - Primary    Unclear etiology.  Labs from April 2023 reviewed, TSH within normal range. Checking CBC and iron studies today.  Stop Depo Provera injections.  Start Junel 1/20 mcg-mg tablets daily.  Discussed instructions for starting and  general use of OCP's.  Consider transvaginal US if no improvement.   Pap smear due next year.      Relevant Medications   norethindrone-ethinyl estradiol (JUNEL 1/20) 1-20 MG-MCG tablet   Other Relevant Orders   IBC + Ferritin   CBC       Doreene Nest, NP

## 2021-11-05 NOTE — Addendum Note (Signed)
Addended by: Doreene Nest on: 11/05/2021 12:54 PM   Modules accepted: Orders

## 2021-11-05 NOTE — Assessment & Plan Note (Addendum)
Unclear etiology.  Labs from April 2023 reviewed, TSH within normal range. Checking CBC and iron studies today.  Stop Depo Provera injections.  Start Junel 1/20 mcg-mg tablets daily.  Discussed instructions for starting and general use of OCP's.  Consider transvaginal US if no improvement.   Pap smear due next year.

## 2021-11-25 ENCOUNTER — Other Ambulatory Visit: Payer: Self-pay

## 2021-11-25 MED ORDER — FLUOXETINE HCL 10 MG PO CAPS
ORAL_CAPSULE | ORAL | 5 refills | Status: DC
Start: 2021-11-25 — End: 2022-08-31
  Filled 2021-11-25: qty 90, 30d supply, fill #0
  Filled 2022-01-14: qty 90, 30d supply, fill #1
  Filled 2022-02-24: qty 90, 30d supply, fill #2
  Filled 2022-04-12: qty 90, 30d supply, fill #3
  Filled 2022-07-09: qty 90, 30d supply, fill #4

## 2021-11-25 MED ORDER — MYDAYIS 25 MG PO CP24
ORAL_CAPSULE | ORAL | 0 refills | Status: DC
Start: 2021-12-25 — End: 2022-08-31
  Filled 2021-12-28 (×2): qty 30, 30d supply, fill #0

## 2021-11-25 MED ORDER — MYDAYIS 25 MG PO CP24
ORAL_CAPSULE | ORAL | 0 refills | Status: DC
Start: 2022-01-23 — End: 2022-03-16
  Filled 2022-01-28: qty 30, 30d supply, fill #0

## 2021-11-25 MED ORDER — MYDAYIS 25 MG PO CP24
ORAL_CAPSULE | ORAL | 0 refills | Status: DC
  Filled 2021-11-25 – 2021-11-29 (×2): qty 30, 30d supply, fill #0

## 2021-11-29 ENCOUNTER — Other Ambulatory Visit: Payer: Self-pay

## 2021-12-28 ENCOUNTER — Other Ambulatory Visit: Payer: Self-pay

## 2021-12-29 ENCOUNTER — Other Ambulatory Visit: Payer: Self-pay

## 2021-12-30 ENCOUNTER — Other Ambulatory Visit: Payer: Self-pay

## 2022-01-17 ENCOUNTER — Other Ambulatory Visit: Payer: Self-pay

## 2022-01-28 ENCOUNTER — Other Ambulatory Visit: Payer: Self-pay

## 2022-02-24 ENCOUNTER — Other Ambulatory Visit: Payer: Self-pay

## 2022-02-25 ENCOUNTER — Other Ambulatory Visit: Payer: Self-pay

## 2022-02-25 MED ORDER — AMPHET-DEXTROAMPHET 3-BEAD ER 25 MG PO CP24
25.0000 mg | ORAL_CAPSULE | Freq: Every morning | ORAL | 0 refills | Status: DC
  Filled 2022-02-25: qty 30, 30d supply, fill #0

## 2022-02-25 MED ORDER — AMPHET-DEXTROAMPHET 3-BEAD ER 25 MG PO CP24: 25.0000 mg | ORAL_CAPSULE | Freq: Every morning | ORAL | 0 refills | Status: DC

## 2022-03-16 ENCOUNTER — Other Ambulatory Visit: Payer: Self-pay

## 2022-03-16 MED ORDER — MYDAYIS 25 MG PO CP24
25.0000 mg | ORAL_CAPSULE | Freq: Every morning | ORAL | 0 refills | Status: DC
  Filled 2022-03-29 – 2022-04-27 (×4): qty 30, 30d supply, fill #0

## 2022-03-29 ENCOUNTER — Other Ambulatory Visit: Payer: Self-pay

## 2022-03-29 MED ORDER — AMPHET-DEXTROAMPHET 3-BEAD ER 25 MG PO CP24
1.0000 | ORAL_CAPSULE | Freq: Every morning | ORAL | 0 refills | Status: DC
  Filled 2022-03-29: qty 30, 30d supply, fill #0

## 2022-04-13 ENCOUNTER — Other Ambulatory Visit: Payer: Self-pay

## 2022-04-27 ENCOUNTER — Other Ambulatory Visit: Payer: Self-pay

## 2022-04-28 ENCOUNTER — Other Ambulatory Visit: Payer: Self-pay

## 2022-04-28 MED ORDER — AMPHET-DEXTROAMPHET 3-BEAD ER 25 MG PO CP24
25.0000 mg | ORAL_CAPSULE | Freq: Every day | ORAL | 0 refills | Status: DC
  Filled 2022-04-28: qty 30, 30d supply, fill #0

## 2022-04-29 ENCOUNTER — Other Ambulatory Visit: Payer: Self-pay | Admitting: Primary Care

## 2022-04-29 ENCOUNTER — Other Ambulatory Visit: Payer: Self-pay

## 2022-04-29 DIAGNOSIS — F900 Attention-deficit hyperactivity disorder, predominantly inattentive type: Secondary | ICD-10-CM | POA: Diagnosis not present

## 2022-04-29 DIAGNOSIS — N939 Abnormal uterine and vaginal bleeding, unspecified: Secondary | ICD-10-CM

## 2022-04-29 DIAGNOSIS — F413 Other mixed anxiety disorders: Secondary | ICD-10-CM | POA: Diagnosis not present

## 2022-04-29 MED ORDER — AMPHET-DEXTROAMPHET 3-BEAD ER 25 MG PO CP24
25.0000 mg | ORAL_CAPSULE | Freq: Every day | ORAL | 0 refills | Status: DC
  Filled 2022-06-26: qty 30, 30d supply, fill #0

## 2022-04-29 MED ORDER — AMPHET-DEXTROAMPHET 3-BEAD ER 25 MG PO CP24: ORAL_CAPSULE | ORAL | 0 refills | Status: DC

## 2022-04-29 MED ORDER — AMPHET-DEXTROAMPHET 3-BEAD ER 25 MG PO CP24: 25.0000 mg | ORAL_CAPSULE | Freq: Every day | ORAL | 0 refills | Status: DC

## 2022-04-29 MED ORDER — AMPHET-DEXTROAMPHET 3-BEAD ER 25 MG PO CP24
25.0000 mg | ORAL_CAPSULE | Freq: Every day | ORAL | 0 refills | Status: DC
  Filled 2022-04-29 – 2022-05-27 (×3): qty 30, 30d supply, fill #0

## 2022-04-29 MED ORDER — FLUOXETINE HCL 10 MG PO CAPS
30.0000 mg | ORAL_CAPSULE | Freq: Every day | ORAL | 2 refills | Status: DC
  Filled 2022-04-29: qty 90, 30d supply, fill #0

## 2022-05-01 ENCOUNTER — Other Ambulatory Visit: Payer: Self-pay

## 2022-05-01 MED ORDER — NORETHINDRONE ACET-ETHINYL EST 1-20 MG-MCG PO TABS
1.0000 | ORAL_TABLET | Freq: Every day | ORAL | 0 refills | Status: DC
  Filled 2022-05-01: qty 84, 84d supply, fill #0

## 2022-05-02 ENCOUNTER — Other Ambulatory Visit: Payer: Self-pay

## 2022-05-06 ENCOUNTER — Other Ambulatory Visit: Payer: Self-pay

## 2022-05-25 ENCOUNTER — Other Ambulatory Visit: Payer: Self-pay

## 2022-05-27 ENCOUNTER — Other Ambulatory Visit: Payer: Self-pay

## 2022-06-26 ENCOUNTER — Other Ambulatory Visit: Payer: Self-pay

## 2022-06-27 ENCOUNTER — Other Ambulatory Visit: Payer: Self-pay

## 2022-06-29 ENCOUNTER — Other Ambulatory Visit: Payer: Self-pay

## 2022-06-29 DIAGNOSIS — F413 Other mixed anxiety disorders: Secondary | ICD-10-CM | POA: Diagnosis not present

## 2022-06-29 DIAGNOSIS — F900 Attention-deficit hyperactivity disorder, predominantly inattentive type: Secondary | ICD-10-CM | POA: Diagnosis not present

## 2022-06-29 DIAGNOSIS — F633 Trichotillomania: Secondary | ICD-10-CM | POA: Diagnosis not present

## 2022-06-29 MED ORDER — FLUOXETINE HCL 10 MG PO CAPS
30.0000 mg | ORAL_CAPSULE | Freq: Every day | ORAL | 2 refills | Status: DC
  Filled 2022-06-29: qty 90, 30d supply, fill #0
  Filled 2022-08-28: qty 90, 30d supply, fill #1
  Filled 2022-10-08: qty 90, 30d supply, fill #2

## 2022-06-29 MED ORDER — AMPHET-DEXTROAMPHET 3-BEAD ER 25 MG PO CP24: 25.0000 mg | ORAL_CAPSULE | Freq: Every day | ORAL | 0 refills | Status: DC

## 2022-06-29 MED ORDER — AMPHET-DEXTROAMPHET 3-BEAD ER 25 MG PO CP24
25.0000 mg | ORAL_CAPSULE | Freq: Every day | ORAL | 0 refills | Status: DC
  Filled 2022-07-27: qty 30, 30d supply, fill #0

## 2022-07-03 ENCOUNTER — Telehealth: Payer: 59 | Admitting: Family Medicine

## 2022-07-03 DIAGNOSIS — J329 Chronic sinusitis, unspecified: Secondary | ICD-10-CM

## 2022-07-03 MED ORDER — AMOXICILLIN-POT CLAVULANATE 875-125 MG PO TABS: 1.0000 | ORAL_TABLET | Freq: Two times a day (BID) | ORAL | 0 refills | Status: DC

## 2022-07-03 NOTE — Progress Notes (Signed)

## 2022-07-10 ENCOUNTER — Other Ambulatory Visit: Payer: Self-pay

## 2022-07-15 DIAGNOSIS — B379 Candidiasis, unspecified: Secondary | ICD-10-CM

## 2022-07-15 MED ORDER — FLUCONAZOLE 150 MG PO TABS: 150.0000 mg | ORAL_TABLET | Freq: Once | ORAL | 0 refills | Status: AC

## 2022-07-15 NOTE — Telephone Encounter (Signed)
Do you need to see patient in office?

## 2022-07-15 NOTE — Addendum Note (Signed)
Addended by: Doreene Nest on: 07/15/2022 03:31 PM   Modules accepted: Orders

## 2022-07-26 ENCOUNTER — Other Ambulatory Visit: Payer: Self-pay

## 2022-07-27 ENCOUNTER — Other Ambulatory Visit: Payer: Self-pay

## 2022-07-28 ENCOUNTER — Other Ambulatory Visit: Payer: Self-pay

## 2022-08-28 ENCOUNTER — Other Ambulatory Visit: Payer: Self-pay

## 2022-08-29 ENCOUNTER — Other Ambulatory Visit: Payer: Self-pay

## 2022-08-30 ENCOUNTER — Other Ambulatory Visit: Payer: Self-pay

## 2022-08-31 ENCOUNTER — Other Ambulatory Visit: Payer: Self-pay

## 2022-08-31 ENCOUNTER — Encounter: Payer: Self-pay | Admitting: Primary Care

## 2022-08-31 ENCOUNTER — Ambulatory Visit (INDEPENDENT_AMBULATORY_CARE_PROVIDER_SITE_OTHER): Payer: 59 | Admitting: Primary Care

## 2022-08-31 VITALS — BP 112/68 | HR 90 | Temp 97.5°F | Ht 68.0 in | Wt 195.0 lb

## 2022-08-31 DIAGNOSIS — N939 Abnormal uterine and vaginal bleeding, unspecified: Secondary | ICD-10-CM | POA: Diagnosis not present

## 2022-08-31 DIAGNOSIS — F909 Attention-deficit hyperactivity disorder, unspecified type: Secondary | ICD-10-CM

## 2022-08-31 DIAGNOSIS — Z23 Encounter for immunization: Secondary | ICD-10-CM

## 2022-08-31 DIAGNOSIS — Z0000 Encounter for general adult medical examination without abnormal findings: Secondary | ICD-10-CM | POA: Diagnosis not present

## 2022-08-31 DIAGNOSIS — L309 Dermatitis, unspecified: Secondary | ICD-10-CM

## 2022-08-31 DIAGNOSIS — N946 Dysmenorrhea, unspecified: Secondary | ICD-10-CM | POA: Diagnosis not present

## 2022-08-31 DIAGNOSIS — N920 Excessive and frequent menstruation with regular cycle: Secondary | ICD-10-CM

## 2022-08-31 DIAGNOSIS — F4323 Adjustment disorder with mixed anxiety and depressed mood: Secondary | ICD-10-CM

## 2022-08-31 MED ORDER — AMPHET-DEXTROAMPHET 3-BEAD ER 25 MG PO CP24
25.0000 mg | ORAL_CAPSULE | Freq: Every day | ORAL | 0 refills | Status: DC
Start: 2022-08-31 — End: 2022-09-30
  Filled 2022-08-31: qty 30, 30d supply, fill #0

## 2022-08-31 NOTE — Progress Notes (Signed)
Subjective:    Patient ID: Nicole Bonilla, female    DOB: 2001/03/10, 22 y.o.   MRN: [id number]  HPI  Nicole Bonilla is a very pleasant 22 y.o. female who presents today for complete physical and follow up of chronic conditions.  Immunizations: -Tetanus: Completed in 2014, due today -HPV: Completed series  Diet: Fair diet.  Exercise: No regular exercise. Plans on starting.   Eye exam: Completed years ago Dental exam: Completes semi-annually    Pap Smear: Never completed, she declines today as she is not prepared and is also on her period.    Wt Readings from Last 3 Encounters:  08/31/22 195 lb (88.5 kg)  11/05/21 163 lb (73.9 kg)  08/02/21 170 lb (77.1 kg)      Review of Systems  Constitutional:  Negative for unexpected weight change.  HENT:  Negative for rhinorrhea.   Respiratory:  Negative for cough and shortness of breath.   Cardiovascular:  Negative for chest pain.  Gastrointestinal:  Negative for constipation and diarrhea.  Genitourinary:  Negative for difficulty urinating and menstrual problem.  Musculoskeletal:  Negative for arthralgias and myalgias.  Skin:  Negative for rash.  Allergic/Immunologic: Negative for environmental allergies.  Neurological:  Negative for dizziness and headaches.  Psychiatric/Behavioral:  The patient is not nervous/anxious.          Past Medical History:  Diagnosis Date   Abnormal facial hair 12/13/2012   ADHD (attention deficit hyperactivity disorder)    Depression    Dyspepsia 12/13/2012   Overweight, pediatric, BMI 85.0-94.9 percentile for age 48/04/2012    Social History   Socioeconomic History   Marital status: Single    Spouse name: Not on file   Number of children: Not on file   Years of education: Not on file   Highest education level: Not on file  Occupational History   Not on file  Tobacco Use   Smoking status: Never   Smokeless tobacco: Not on file  Vaping Use   Vaping Use: Never used  Substance and  Sexual Activity   Alcohol use: No   Drug use: No   Sexual activity: Never  Other Topics Concern   Not on file  Social History Narrative   Is in 6th grade at [employer]   Social Determinants of Health   Financial Resource Strain: Not on file  Food Insecurity: Not on file  Transportation Needs: Not on file  Physical Activity: Not on file  Stress: Not on file  Social Connections: Not on file  Intimate Partner Violence: Not on file    Past Surgical History:  Procedure Laterality Date   WISDOM TOOTH EXTRACTION      Family History  Problem Relation Age of Onset   Colon cancer Maternal Grandmother     No Known Allergies  Current Outpatient Medications on File Prior to Visit  Medication Sig Dispense Refill   FLUoxetine (PROZAC) 10 MG capsule Take 3 capsules (30 mg total) by mouth daily. 90 capsule 2   norethindrone-ethinyl estradiol (LARIN 1/20) 1-20 MG-MCG tablet Take 1 tablet by mouth daily. 84 tablet 0   triamcinolone ointment (KENALOG) 0.5 % Apply 1 application topically 2 (two) times daily. 30 g 0   No current facility-administered medications on file prior to visit.    BP 112/68   Pulse 90   Temp (!) 97.5 F (36.4 C) (Temporal)   Ht 5\' 8"  (1.727 m)   Wt 195 lb (88.5 kg)   SpO2 98%   BMI  29.65 kg/m  Objective:   Physical Exam HENT:     Right Ear: Tympanic membrane and ear canal normal.     Left Ear: Tympanic membrane and ear canal normal.     Nose: Nose normal.  Eyes:     Conjunctiva/sclera: Conjunctivae normal.     Pupils: Pupils are equal, round, and reactive to light.  Neck:     Thyroid: No thyromegaly.  Cardiovascular:     Rate and Rhythm: Normal rate and regular rhythm.     Heart sounds: No murmur heard. Pulmonary:     Effort: Pulmonary effort is normal.     Breath sounds: Normal breath sounds. No rales.  Abdominal:     General: Bowel sounds are normal.     Palpations: Abdomen is soft.     Tenderness: There is no abdominal tenderness.   Musculoskeletal:        General: Normal range of motion.     Cervical back: Neck supple.  Lymphadenopathy:     Cervical: No cervical adenopathy.  Skin:    General: Skin is warm and dry.     Findings: No rash.  Neurological:     Mental Status: She is alert and oriented to person, place, and time.     Cranial Nerves: No cranial nerve deficit.     Deep Tendon Reflexes: Reflexes are normal and symmetric.  Psychiatric:        Mood and Affect: Mood normal.           Assessment & Plan:  Preventative health care Assessment & Plan: Tetanus due, provided today. Pap smear due, she declines today but will schedule to return.  Discussed the importance of a healthy diet and regular exercise in order for weight loss, and to reduce the risk of further co-morbidity.  Exam stable. Reviewed labs from 2023.  Follow up in 1 year for repeat physical.    Eczema, unspecified type Assessment & Plan: Controlled.  No concerns today. Continue triamcinolone 0.5% ointment PRN   Abnormal uterine bleeding Assessment & Plan: Improved.  Continue Larin 1/20 mg-mcg daily.    Dysmenorrhea Assessment & Plan: Controlled.  Continue OCPs daily.   Attention deficit hyperactivity disorder (ADHD), unspecified ADHD type Assessment & Plan: Controlled.  Continue Mydayis 25 mg daily We will take over this prescription.  Refills provided.  Will obtain records from psychiatry.   Orders: -     Amphet-Dextroamphet 3-Bead ER; Take 1 capsule (25 mg total) by mouth daily.  Dispense: 30 capsule; Refill: 0  Adjustment disorder with mixed anxiety and depressed mood Assessment & Plan: Controlled.  Continue fluoxetine 10 mg daily. We will take over this prescription. She does not need refills.   Menorrhagia with regular cycle Assessment & Plan: Controlled.  Continue OCPs daily.         Doreene Nest, NP

## 2022-08-31 NOTE — Assessment & Plan Note (Signed)
Controlled.  Continue fluoxetine 10 mg daily. We will take over this prescription. She does not need refills.

## 2022-08-31 NOTE — Assessment & Plan Note (Signed)
Tetanus due, provided today. Pap smear due, she declines today but will schedule to return.  Discussed the importance of a healthy diet and regular exercise in order for weight loss, and to reduce the risk of further co-morbidity.  Exam stable. Reviewed labs from 2023.  Follow up in 1 year for repeat physical.

## 2022-08-31 NOTE — Assessment & Plan Note (Signed)
Improved.  Continue Larin 1/20 mg-mcg daily.

## 2022-08-31 NOTE — Assessment & Plan Note (Signed)
Controlled.  Continue OCP's daily. 

## 2022-08-31 NOTE — Patient Instructions (Signed)
Schedule to return for your pap smear when you're ready.  It was a pleasure to see you today!

## 2022-08-31 NOTE — Assessment & Plan Note (Signed)
Controlled.  Continue Mydayis 25 mg daily We will take over this prescription.  Refills provided.  Will obtain records from psychiatry.

## 2022-08-31 NOTE — Assessment & Plan Note (Signed)
Controlled.  No concerns today. Continue triamcinolone 0.5% ointment PRN

## 2022-09-02 ENCOUNTER — Other Ambulatory Visit: Payer: Self-pay

## 2022-09-14 ENCOUNTER — Other Ambulatory Visit: Payer: Self-pay | Admitting: Primary Care

## 2022-09-14 ENCOUNTER — Other Ambulatory Visit: Payer: Self-pay

## 2022-09-14 DIAGNOSIS — N939 Abnormal uterine and vaginal bleeding, unspecified: Secondary | ICD-10-CM

## 2022-09-14 MED ORDER — NORETHINDRONE ACET-ETHINYL EST 1-20 MG-MCG PO TABS
1.0000 | ORAL_TABLET | Freq: Every day | ORAL | 3 refills | Status: DC
Start: 2022-09-14 — End: 2023-09-25
  Filled 2022-09-14: qty 84, 84d supply, fill #0
  Filled 2022-11-29: qty 84, 84d supply, fill #1
  Filled 2023-02-23: qty 84, 84d supply, fill #2
  Filled 2023-05-27: qty 84, 84d supply, fill #3

## 2022-09-30 ENCOUNTER — Other Ambulatory Visit: Payer: Self-pay | Admitting: Primary Care

## 2022-09-30 ENCOUNTER — Other Ambulatory Visit: Payer: Self-pay

## 2022-09-30 DIAGNOSIS — F909 Attention-deficit hyperactivity disorder, unspecified type: Secondary | ICD-10-CM

## 2022-09-30 DIAGNOSIS — F4323 Adjustment disorder with mixed anxiety and depressed mood: Secondary | ICD-10-CM

## 2022-09-30 MED ORDER — AMPHET-DEXTROAMPHET 3-BEAD ER 25 MG PO CP24
25.0000 mg | ORAL_CAPSULE | Freq: Every day | ORAL | 0 refills | Status: DC
Start: 2022-09-30 — End: 2022-10-26
  Filled 2022-09-30: qty 30, 30d supply, fill #0

## 2022-10-26 ENCOUNTER — Other Ambulatory Visit: Payer: Self-pay

## 2022-10-26 MED ORDER — AMPHET-DEXTROAMPHET 3-BEAD ER 25 MG PO CP24
25.0000 mg | ORAL_CAPSULE | Freq: Every day | ORAL | 0 refills | Status: DC
Start: 2022-10-26 — End: 2022-11-24
  Filled 2022-10-26 – 2022-10-28 (×2): qty 30, 30d supply, fill #0

## 2022-10-26 MED ORDER — FLUOXETINE HCL 10 MG PO CAPS
30.0000 mg | ORAL_CAPSULE | Freq: Every day | ORAL | 2 refills | Status: DC
Start: 2022-10-26 — End: 2023-10-22
  Filled 2022-10-26 – 2022-11-29 (×2): qty 270, 90d supply, fill #0
  Filled 2023-03-20 (×2): qty 270, 90d supply, fill #1
  Filled 2023-06-18: qty 270, 90d supply, fill #2

## 2022-10-28 ENCOUNTER — Other Ambulatory Visit: Payer: Self-pay

## 2022-11-24 ENCOUNTER — Other Ambulatory Visit: Payer: Self-pay

## 2022-11-24 ENCOUNTER — Other Ambulatory Visit: Payer: Self-pay | Admitting: Primary Care

## 2022-11-24 DIAGNOSIS — F909 Attention-deficit hyperactivity disorder, unspecified type: Secondary | ICD-10-CM

## 2022-11-24 MED ORDER — AMPHET-DEXTROAMPHET 3-BEAD ER 25 MG PO CP24
25.0000 mg | ORAL_CAPSULE | Freq: Every day | ORAL | 0 refills | Status: DC
Start: 2022-11-24 — End: 2022-12-26
  Filled 2022-11-24 – 2022-11-25 (×2): qty 30, 30d supply, fill #0

## 2022-11-25 ENCOUNTER — Other Ambulatory Visit: Payer: Self-pay

## 2022-11-29 ENCOUNTER — Other Ambulatory Visit: Payer: Self-pay

## 2022-11-30 ENCOUNTER — Other Ambulatory Visit: Payer: Self-pay

## 2022-12-07 ENCOUNTER — Other Ambulatory Visit: Payer: Self-pay

## 2022-12-26 ENCOUNTER — Other Ambulatory Visit: Payer: Self-pay | Admitting: Primary Care

## 2022-12-26 DIAGNOSIS — F909 Attention-deficit hyperactivity disorder, unspecified type: Secondary | ICD-10-CM

## 2022-12-27 ENCOUNTER — Other Ambulatory Visit: Payer: Self-pay

## 2022-12-27 MED ORDER — AMPHET-DEXTROAMPHET 3-BEAD ER 25 MG PO CP24
25.0000 mg | ORAL_CAPSULE | Freq: Every day | ORAL | 0 refills | Status: DC
Start: 2022-12-27 — End: 2023-01-24
  Filled 2022-12-27: qty 30, 30d supply, fill #0

## 2023-01-24 ENCOUNTER — Other Ambulatory Visit: Payer: Self-pay

## 2023-01-24 ENCOUNTER — Other Ambulatory Visit: Payer: Self-pay | Admitting: Primary Care

## 2023-01-24 DIAGNOSIS — F909 Attention-deficit hyperactivity disorder, unspecified type: Secondary | ICD-10-CM

## 2023-01-24 MED FILL — Amphetamine-Dextroamphetamine 3-Bead Cap ER 24HR 25 MG: ORAL | 30 days supply | Qty: 30 | Fill #0 | Status: AC

## 2023-01-25 ENCOUNTER — Other Ambulatory Visit: Payer: Self-pay

## 2023-02-23 ENCOUNTER — Other Ambulatory Visit: Payer: Self-pay | Admitting: Primary Care

## 2023-02-23 ENCOUNTER — Other Ambulatory Visit: Payer: Self-pay

## 2023-02-23 DIAGNOSIS — F909 Attention-deficit hyperactivity disorder, unspecified type: Secondary | ICD-10-CM

## 2023-02-23 MED FILL — Amphetamine-Dextroamphetamine 3-Bead Cap ER 24HR 25 MG: ORAL | 30 days supply | Qty: 30 | Fill #0 | Status: AC

## 2023-02-24 ENCOUNTER — Other Ambulatory Visit: Payer: Self-pay

## 2023-03-20 ENCOUNTER — Other Ambulatory Visit: Payer: Self-pay

## 2023-03-26 ENCOUNTER — Other Ambulatory Visit: Payer: Self-pay | Admitting: Primary Care

## 2023-03-26 DIAGNOSIS — F909 Attention-deficit hyperactivity disorder, unspecified type: Secondary | ICD-10-CM

## 2023-03-26 MED ORDER — AMPHET-DEXTROAMPHET 3-BEAD ER 25 MG PO CP24
25.0000 mg | ORAL_CAPSULE | Freq: Every day | ORAL | 0 refills | Status: DC
  Filled 2023-03-26 – 2023-03-28 (×2): qty 30, 30d supply, fill #0

## 2023-03-27 ENCOUNTER — Other Ambulatory Visit: Payer: Self-pay

## 2023-03-28 ENCOUNTER — Other Ambulatory Visit: Payer: Self-pay

## 2023-04-24 ENCOUNTER — Other Ambulatory Visit: Payer: Self-pay | Admitting: Primary Care

## 2023-04-24 DIAGNOSIS — F909 Attention-deficit hyperactivity disorder, unspecified type: Secondary | ICD-10-CM

## 2023-04-25 ENCOUNTER — Other Ambulatory Visit: Payer: Self-pay

## 2023-04-25 MED ORDER — AMPHET-DEXTROAMPHET 3-BEAD ER 25 MG PO CP24
25.0000 mg | ORAL_CAPSULE | Freq: Every day | ORAL | 0 refills | Status: DC
  Filled 2023-04-25: qty 30, 30d supply, fill #0

## 2023-05-17 ENCOUNTER — Other Ambulatory Visit: Payer: Self-pay

## 2023-05-17 DIAGNOSIS — F909 Attention-deficit hyperactivity disorder, unspecified type: Secondary | ICD-10-CM

## 2023-05-18 ENCOUNTER — Other Ambulatory Visit: Payer: Self-pay

## 2023-05-18 MED ORDER — AMPHET-DEXTROAMPHET 3-BEAD ER 25 MG PO CP24
25.0000 mg | ORAL_CAPSULE | Freq: Every day | ORAL | 0 refills | Status: DC
  Filled 2023-05-18 – 2023-05-19 (×2): qty 30, 30d supply, fill #0

## 2023-05-18 NOTE — Telephone Encounter (Signed)
 Pt is in need of medication soon due to travel. Are you able to take care of this in case Jae Dire does not see it before tomorrow afternoon?

## 2023-05-19 ENCOUNTER — Other Ambulatory Visit (HOSPITAL_BASED_OUTPATIENT_CLINIC_OR_DEPARTMENT_OTHER): Payer: Self-pay

## 2023-05-19 ENCOUNTER — Other Ambulatory Visit: Payer: Self-pay

## 2023-06-23 ENCOUNTER — Other Ambulatory Visit: Payer: Self-pay | Admitting: Primary Care

## 2023-06-23 DIAGNOSIS — F909 Attention-deficit hyperactivity disorder, unspecified type: Secondary | ICD-10-CM

## 2023-06-25 ENCOUNTER — Other Ambulatory Visit: Payer: Self-pay

## 2023-06-25 MED ORDER — AMPHET-DEXTROAMPHET 3-BEAD ER 25 MG PO CP24
25.0000 mg | ORAL_CAPSULE | Freq: Every day | ORAL | 0 refills | Status: DC
  Filled 2023-06-25: qty 30, 30d supply, fill #0

## 2023-07-27 ENCOUNTER — Other Ambulatory Visit: Payer: Self-pay | Admitting: Primary Care

## 2023-07-27 ENCOUNTER — Other Ambulatory Visit: Payer: Self-pay

## 2023-07-27 DIAGNOSIS — F909 Attention-deficit hyperactivity disorder, unspecified type: Secondary | ICD-10-CM

## 2023-07-27 MED FILL — Amphetamine-Dextroamphetamine 3-Bead Cap ER 24HR 25 MG: ORAL | 30 days supply | Qty: 30 | Fill #0 | Status: AC

## 2023-08-22 ENCOUNTER — Other Ambulatory Visit: Payer: Self-pay

## 2023-08-22 ENCOUNTER — Other Ambulatory Visit: Payer: Self-pay | Admitting: Primary Care

## 2023-08-22 DIAGNOSIS — F909 Attention-deficit hyperactivity disorder, unspecified type: Secondary | ICD-10-CM

## 2023-08-22 MED ORDER — AMPHET-DEXTROAMPHET 3-BEAD ER 25 MG PO CP24
25.0000 mg | ORAL_CAPSULE | Freq: Every day | ORAL | 0 refills | Status: DC
  Filled 2023-08-22 – 2023-08-25 (×2): qty 30, 30d supply, fill #0

## 2023-08-25 ENCOUNTER — Other Ambulatory Visit: Payer: Self-pay

## 2023-09-01 ENCOUNTER — Ambulatory Visit (INDEPENDENT_AMBULATORY_CARE_PROVIDER_SITE_OTHER): Admitting: Primary Care

## 2023-09-01 ENCOUNTER — Encounter: Payer: Self-pay | Admitting: Primary Care

## 2023-09-01 ENCOUNTER — Other Ambulatory Visit (HOSPITAL_COMMUNITY)
Admission: RE | Admit: 2023-09-01 | Discharge: 2023-09-01 | Disposition: A | Discharge status: Home / Self Care | Source: Ambulatory Visit | Attending: Primary Care | Admitting: Primary Care

## 2023-09-01 VITALS — BP 118/66 | HR 113 | Temp 97.2°F | Ht 68.0 in | Wt 198.0 lb

## 2023-09-01 DIAGNOSIS — F909 Attention-deficit hyperactivity disorder, unspecified type: Secondary | ICD-10-CM | POA: Diagnosis not present

## 2023-09-01 DIAGNOSIS — N946 Dysmenorrhea, unspecified: Secondary | ICD-10-CM | POA: Diagnosis not present

## 2023-09-01 DIAGNOSIS — Z1151 Encounter for screening for human papillomavirus (HPV): Secondary | ICD-10-CM | POA: Diagnosis not present

## 2023-09-01 DIAGNOSIS — Z0000 Encounter for general adult medical examination without abnormal findings: Secondary | ICD-10-CM

## 2023-09-01 DIAGNOSIS — L309 Dermatitis, unspecified: Secondary | ICD-10-CM | POA: Diagnosis not present

## 2023-09-01 DIAGNOSIS — Z124 Encounter for screening for malignant neoplasm of cervix: Secondary | ICD-10-CM | POA: Insufficient documentation

## 2023-09-01 DIAGNOSIS — N920 Excessive and frequent menstruation with regular cycle: Secondary | ICD-10-CM | POA: Diagnosis not present

## 2023-09-01 DIAGNOSIS — F4323 Adjustment disorder with mixed anxiety and depressed mood: Secondary | ICD-10-CM

## 2023-09-01 NOTE — Progress Notes (Signed)
 Subjective:    Patient ID: Nicole Bonilla, female    DOB: 2000-12-03, 23 y.o.   MRN: [id number]  HPI  Nicole Bonilla is a very pleasant 23 y.o. female who presents today for complete physical and follow up of chronic conditions.  Immunizations: -Tetanus: Completed in 2024 -HPV: Completed series   Diet: Fair diet.  Exercise: Some exercise 1 weekly  Eye exam: Completed years ago Dental exam: Completes semi-annually    Pap Smear: Never completed  BP Readings from Last 3 Encounters:  09/01/23 118/66  08/31/22 112/68  11/05/21 110/80       Review of Systems  Constitutional:  Negative for unexpected weight change.  HENT:  Negative for rhinorrhea.   Respiratory:  Negative for cough and shortness of breath.   Cardiovascular:  Negative for chest pain.  Gastrointestinal:  Negative for constipation and diarrhea.  Genitourinary:  Negative for difficulty urinating.  Musculoskeletal:  Negative for arthralgias and myalgias.  Skin:  Negative for rash.  Allergic/Immunologic: Negative for environmental allergies.  Neurological:  Negative for dizziness and headaches.  Psychiatric/Behavioral:  The patient is not nervous/anxious.          Past Medical History:  Diagnosis Date   Abnormal facial hair 12/13/2012   ADHD (attention deficit hyperactivity disorder)    Depression    Dyspepsia 12/13/2012   Overweight, pediatric, BMI 85.0-94.9 percentile for age 36/04/2012    Social History   Socioeconomic History   Marital status: Single    Spouse name: Not on file   Number of children: Not on file   Years of education: Not on file   Highest education level: Some college, no degree  Occupational History   Not on file  Tobacco Use   Smoking status: Never   Smokeless tobacco: Not on file  Vaping Use   Vaping status: Never Used  Substance and Sexual Activity   Alcohol use: No   Drug use: No   Sexual activity: Never  Other Topics Concern   Not on file  Social History  Narrative   Is in 6th grade at [employer]   Social Drivers of Health   Financial Resource Strain: Low Risk  (08/28/2023)   Overall Financial Resource Strain (CARDIA)    Difficulty of Paying Living Expenses: Not hard at all  Food Insecurity: No Food Insecurity (08/28/2023)   Hunger Vital Sign    Worried About Running Out of Food in the Last Year: Never true    Ran Out of Food in the Last Year: Never true  Transportation Needs: No Transportation Needs (08/28/2023)   PRAPARE - Administrator, Civil Service (Medical): No    Lack of Transportation (Non-Medical): No  Physical Activity: Insufficiently Active (08/28/2023)   Exercise Vital Sign    Days of Exercise per Week: 1 day    Minutes of Exercise per Session: 20 min  Stress: No Stress Concern Present (08/28/2023)   [employer] of Occupational Health - Occupational Stress Questionnaire    Feeling of Stress: Only a little  Social Connections: Socially Isolated (08/28/2023)   Social Connection and Isolation Panel    Frequency of Communication with Friends and Family: More than three times a week    Frequency of Social Gatherings with Friends and Family: Once a week    Attends Religious Services: Never    Database administrator or Organizations: No    Attends Engineer, structural: Not on file    Marital Status: Never married  Intimate  Partner Violence: Not on file    Past Surgical History:  Procedure Laterality Date   WISDOM TOOTH EXTRACTION      Family History  Problem Relation Age of Onset   Colon cancer Maternal Grandmother     No Known Allergies  Current Outpatient Medications on File Prior to Visit  Medication Sig Dispense Refill   Amphet-Dextroamphet 3-Bead ER 25 MG CP24 Take 1 capsule (25 mg total) by mouth daily. 30 capsule 0   FLUoxetine  (PROZAC ) 10 MG capsule Take 3 capsules (30 mg total) by mouth daily. 270 capsule 2   norethindrone -ethinyl estradiol  (LARIN  1/20) 1-20 MG-MCG tablet Take 1  tablet by mouth daily. 84 tablet 3   triamcinolone  ointment (KENALOG ) 0.5 % Apply 1 application topically 2 (two) times daily. (Patient not taking: Reported on 09/01/2023) 30 g 0   No current facility-administered medications on file prior to visit.    BP 118/66   Pulse (!) 113   Temp (!) 97.2 F (36.2 C) (Temporal)   Ht 5' 8 (1.727 m)   Wt 198 lb (89.8 kg)   SpO2 97%   BMI 30.11 kg/m  Objective:   Physical Exam Exam conducted with a chaperone present.  HENT:     Right Ear: Tympanic membrane and ear canal normal.     Left Ear: Tympanic membrane and ear canal normal.   Eyes:     Pupils: Pupils are equal, round, and reactive to light.    Cardiovascular:     Rate and Rhythm: Normal rate and regular rhythm.  Pulmonary:     Effort: Pulmonary effort is normal.     Breath sounds: Normal breath sounds.  Abdominal:     General: Bowel sounds are normal.     Palpations: Abdomen is soft.     Tenderness: There is no abdominal tenderness.  Genitourinary:    Labia:        Right: No rash, tenderness or lesion.        Left: No rash, tenderness or lesion.      Vagina: Normal.     Cervix: Normal.     Uterus: Normal.      Adnexa: Right adnexa normal and left adnexa normal.   Musculoskeletal:        General: Normal range of motion.     Cervical back: Neck supple.   Skin:    General: Skin is warm and dry.   Neurological:     Mental Status: She is alert and oriented to person, place, and time.     Cranial Nerves: No cranial nerve deficit.     Deep Tendon Reflexes:     Reflex Scores:      Patellar reflexes are 2+ on the right side and 2+ on the left side.  Psychiatric:        Mood and Affect: Mood normal.           Assessment & Plan:  Eczema, unspecified type Assessment & Plan: Stable.   Continue triamcinolone  0.5% PRN.   Dysmenorrhea Assessment & Plan: Controlled.  Continue OCP continuously.  Pap smear completed today.   Attention deficit hyperactivity  disorder (ADHD), unspecified ADHD type Assessment & Plan: Controlled.  Continue amphetamine -dextroamphetamine  25 mg daily. No suspicious activity on PDMP aware.   Adjustment disorder with mixed anxiety and depressed mood Assessment & Plan: Controlled.  She is actually taking 20 mg of fluoxetine  daily. Continue fluoxetine  20 mg daily.   Menorrhagia with regular cycle Assessment & Plan: Controlled.  Continue OCPs  continuously. Pap smear completed today.   Preventative health care Assessment & Plan: Immunizations UTD. Pap smear due, completed today.  Discussed the importance of a healthy diet and regular exercise in order for weight loss, and to reduce the risk of further co-morbidity.  Exam stable. Labs pending.  Follow up in 1 year for repeat physical.    Screening for cervical cancer -     Cytology - PAP        Gabriel John, NP

## 2023-09-01 NOTE — Assessment & Plan Note (Signed)
 Controlled.  Continue OCP continuously.  Pap smear completed today.

## 2023-09-01 NOTE — Assessment & Plan Note (Signed)
 Controlled.  Continue amphetamine -dextroamphetamine  25 mg daily. No suspicious activity on PDMP aware.

## 2023-09-01 NOTE — Assessment & Plan Note (Signed)
Immunizations UTD. Pap smear due, completed today.  Discussed the importance of a healthy diet and regular exercise in order for weight loss, and to reduce the risk of further co-morbidity.  Exam stable. Labs pending.  Follow up in 1 year for repeat physical.  

## 2023-09-01 NOTE — Assessment & Plan Note (Signed)
 Stable.   Continue triamcinolone  0.5% PRN.

## 2023-09-01 NOTE — Patient Instructions (Signed)
 It was a pleasure to see you today!

## 2023-09-01 NOTE — Assessment & Plan Note (Signed)
 Controlled.  She is actually taking 20 mg of fluoxetine  daily. Continue fluoxetine  20 mg daily.

## 2023-09-01 NOTE — Assessment & Plan Note (Signed)
 Controlled.  Continue OCPs continuously. Pap smear completed today.

## 2023-09-08 ENCOUNTER — Ambulatory Visit: Payer: Self-pay | Admitting: Primary Care

## 2023-09-08 LAB — CYTOLOGY - PAP
Comment: NEGATIVE
Diagnosis: NEGATIVE
High risk HPV: NEGATIVE

## 2023-09-25 ENCOUNTER — Other Ambulatory Visit: Payer: Self-pay

## 2023-09-25 ENCOUNTER — Other Ambulatory Visit: Payer: Self-pay | Admitting: Primary Care

## 2023-09-25 DIAGNOSIS — F909 Attention-deficit hyperactivity disorder, unspecified type: Secondary | ICD-10-CM

## 2023-09-25 DIAGNOSIS — N939 Abnormal uterine and vaginal bleeding, unspecified: Secondary | ICD-10-CM

## 2023-09-25 MED FILL — Norethindrone Ace & Ethinyl Estradiol Tab 1 MG-20 MCG: ORAL | 84 days supply | Qty: 84 | Fill #0 | Status: AC

## 2023-09-25 MED FILL — Amphetamine-Dextroamphetamine 3-Bead Cap ER 24HR 25 MG: ORAL | 30 days supply | Qty: 30 | Fill #0 | Status: AC

## 2023-09-26 ENCOUNTER — Other Ambulatory Visit: Payer: Self-pay

## 2023-10-22 ENCOUNTER — Other Ambulatory Visit: Payer: Self-pay

## 2023-10-22 ENCOUNTER — Other Ambulatory Visit: Payer: Self-pay | Admitting: Primary Care

## 2023-10-22 DIAGNOSIS — F4323 Adjustment disorder with mixed anxiety and depressed mood: Secondary | ICD-10-CM

## 2023-10-22 DIAGNOSIS — F909 Attention-deficit hyperactivity disorder, unspecified type: Secondary | ICD-10-CM

## 2023-10-22 MED ORDER — FLUOXETINE HCL 10 MG PO CAPS
30.0000 mg | ORAL_CAPSULE | Freq: Every day | ORAL | 2 refills | Status: AC
  Filled 2023-10-22: qty 270, 90d supply, fill #0
  Filled 2024-03-16: qty 270, 90d supply, fill #1

## 2023-10-22 MED ORDER — AMPHET-DEXTROAMPHET 3-BEAD ER 25 MG PO CP24
25.0000 mg | ORAL_CAPSULE | Freq: Every day | ORAL | 0 refills | Status: DC
  Filled 2023-10-22: qty 30, 30d supply, fill #0

## 2023-11-16 ENCOUNTER — Other Ambulatory Visit: Payer: Self-pay | Admitting: Primary Care

## 2023-11-16 ENCOUNTER — Other Ambulatory Visit: Payer: Self-pay

## 2023-11-16 DIAGNOSIS — F909 Attention-deficit hyperactivity disorder, unspecified type: Secondary | ICD-10-CM

## 2023-11-16 MED FILL — Norethindrone Ace & Ethinyl Estradiol Tab 1 MG-20 MCG: ORAL | 84 days supply | Qty: 84 | Fill #1 | Status: CN

## 2023-11-16 MED FILL — Amphetamine-Dextroamphetamine 3-Bead Cap ER 24HR 25 MG: ORAL | 30 days supply | Qty: 30 | Fill #0 | Status: CN

## 2023-11-18 ENCOUNTER — Other Ambulatory Visit: Payer: Self-pay

## 2023-11-18 MED FILL — Amphetamine-Dextroamphetamine 3-Bead Cap ER 24HR 25 MG: ORAL | 30 days supply | Qty: 30 | Fill #0 | Status: CN

## 2023-11-20 ENCOUNTER — Other Ambulatory Visit: Payer: Self-pay

## 2023-11-20 MED FILL — Amphetamine-Dextroamphetamine 3-Bead Cap ER 24HR 25 MG: ORAL | 30 days supply | Qty: 30 | Fill #0 | Status: AC

## 2023-12-01 ENCOUNTER — Other Ambulatory Visit: Payer: Self-pay

## 2023-12-01 MED FILL — Norethindrone Ace & Ethinyl Estradiol Tab 1 MG-20 MCG: ORAL | 84 days supply | Qty: 84 | Fill #1 | Status: AC

## 2023-12-21 ENCOUNTER — Other Ambulatory Visit: Payer: Self-pay | Admitting: Primary Care

## 2023-12-21 ENCOUNTER — Other Ambulatory Visit: Payer: Self-pay

## 2023-12-21 DIAGNOSIS — F909 Attention-deficit hyperactivity disorder, unspecified type: Secondary | ICD-10-CM

## 2023-12-21 MED FILL — Amphetamine-Dextroamphetamine 3-Bead Cap ER 24HR 25 MG: ORAL | 30 days supply | Qty: 30 | Fill #0 | Status: AC

## 2024-01-18 ENCOUNTER — Other Ambulatory Visit: Payer: Self-pay

## 2024-01-18 ENCOUNTER — Other Ambulatory Visit: Payer: Self-pay | Admitting: Primary Care

## 2024-01-18 DIAGNOSIS — F909 Attention-deficit hyperactivity disorder, unspecified type: Secondary | ICD-10-CM

## 2024-01-18 MED ORDER — AMPHET-DEXTROAMPHET 3-BEAD ER 25 MG PO CP24
25.0000 mg | ORAL_CAPSULE | Freq: Every day | ORAL | 0 refills | Status: DC
  Filled 2024-01-18: qty 30, 30d supply, fill #0

## 2024-02-16 ENCOUNTER — Other Ambulatory Visit: Payer: Self-pay

## 2024-02-16 ENCOUNTER — Other Ambulatory Visit: Payer: Self-pay | Admitting: Primary Care

## 2024-02-16 DIAGNOSIS — F909 Attention-deficit hyperactivity disorder, unspecified type: Secondary | ICD-10-CM

## 2024-02-16 MED ORDER — AMPHET-DEXTROAMPHET 3-BEAD ER 25 MG PO CP24
25.0000 mg | ORAL_CAPSULE | Freq: Every day | ORAL | 0 refills | Status: DC
  Filled 2024-02-16: qty 30, 30d supply, fill #0

## 2024-02-16 MED FILL — Norethindrone Ace & Ethinyl Estradiol Tab 1 MG-20 MCG: ORAL | 84 days supply | Qty: 84 | Fill #2 | Status: AC

## 2024-03-16 ENCOUNTER — Other Ambulatory Visit: Payer: Self-pay | Admitting: Primary Care

## 2024-03-16 DIAGNOSIS — F909 Attention-deficit hyperactivity disorder, unspecified type: Secondary | ICD-10-CM

## 2024-03-17 MED ORDER — AMPHET-DEXTROAMPHET 3-BEAD ER 25 MG PO CP24
25.0000 mg | ORAL_CAPSULE | Freq: Every day | ORAL | 0 refills | Status: DC
  Filled 2024-03-17: qty 30, 30d supply, fill #0

## 2024-03-18 ENCOUNTER — Other Ambulatory Visit: Payer: Self-pay

## 2024-03-29 ENCOUNTER — Other Ambulatory Visit: Payer: Self-pay

## 2024-04-17 ENCOUNTER — Other Ambulatory Visit: Payer: Self-pay | Admitting: Primary Care

## 2024-04-17 ENCOUNTER — Other Ambulatory Visit: Payer: Self-pay

## 2024-04-17 DIAGNOSIS — F909 Attention-deficit hyperactivity disorder, unspecified type: Secondary | ICD-10-CM

## 2024-04-17 MED ORDER — AMPHET-DEXTROAMPHET 3-BEAD ER 25 MG PO CP24
25.0000 mg | ORAL_CAPSULE | Freq: Every day | ORAL | 0 refills | Status: AC
  Filled 2024-04-17: qty 30, 30d supply, fill #0

## 2024-04-18 ENCOUNTER — Other Ambulatory Visit: Payer: Self-pay
